# Patient Record
Sex: Male | Born: 1940 | Race: White | Hispanic: No | Marital: Married | Smoking: Never smoker
Health system: Southern US, Community
[De-identification: ages and names within clinical notes are randomized; demographics above are authoritative.]

## PROBLEM LIST (undated history)

## (undated) DIAGNOSIS — N289 Disorder of kidney and ureter, unspecified: Secondary | ICD-10-CM

## (undated) DIAGNOSIS — F0280 Dementia in other diseases classified elsewhere without behavioral disturbance: Secondary | ICD-10-CM

## (undated) DIAGNOSIS — I10 Essential (primary) hypertension: Secondary | ICD-10-CM

## (undated) DIAGNOSIS — G4700 Insomnia, unspecified: Secondary | ICD-10-CM

## (undated) DIAGNOSIS — N400 Enlarged prostate without lower urinary tract symptoms: Secondary | ICD-10-CM

## (undated) DIAGNOSIS — H269 Unspecified cataract: Secondary | ICD-10-CM

## (undated) DIAGNOSIS — G309 Alzheimer's disease, unspecified: Secondary | ICD-10-CM

---

## 2018-12-02 ENCOUNTER — Emergency Department: Payer: Medicare Other

## 2018-12-02 ENCOUNTER — Encounter: Payer: Self-pay | Admitting: Emergency Medicine

## 2018-12-02 ENCOUNTER — Other Ambulatory Visit: Payer: Self-pay

## 2018-12-02 ENCOUNTER — Emergency Department
Admission: EM | Admit: 2018-12-02 | Discharge: 2018-12-02 | Disposition: A | Discharge status: Skilled Nursing Facility | Payer: Medicare Other | Attending: Student in an Organized Health Care Education/Training Program | Admitting: Student in an Organized Health Care Education/Training Program

## 2018-12-02 DIAGNOSIS — N189 Chronic kidney disease, unspecified: Secondary | ICD-10-CM | POA: Insufficient documentation

## 2018-12-02 DIAGNOSIS — Z7982 Long term (current) use of aspirin: Secondary | ICD-10-CM | POA: Insufficient documentation

## 2018-12-02 DIAGNOSIS — G309 Alzheimer's disease, unspecified: Secondary | ICD-10-CM | POA: Insufficient documentation

## 2018-12-02 DIAGNOSIS — R531 Weakness: Secondary | ICD-10-CM | POA: Diagnosis not present

## 2018-12-02 DIAGNOSIS — N3000 Acute cystitis without hematuria: Secondary | ICD-10-CM | POA: Insufficient documentation

## 2018-12-02 DIAGNOSIS — Z79899 Other long term (current) drug therapy: Secondary | ICD-10-CM | POA: Diagnosis not present

## 2018-12-02 DIAGNOSIS — R55 Syncope and collapse: Secondary | ICD-10-CM | POA: Diagnosis present

## 2018-12-02 DIAGNOSIS — I129 Hypertensive chronic kidney disease with stage 1 through stage 4 chronic kidney disease, or unspecified chronic kidney disease: Secondary | ICD-10-CM | POA: Diagnosis not present

## 2018-12-02 HISTORY — DX: Benign prostatic hyperplasia without lower urinary tract symptoms: N40.0

## 2018-12-02 HISTORY — DX: Dementia in other diseases classified elsewhere, unspecified severity, without behavioral disturbance, psychotic disturbance, mood disturbance, and anxiety: F02.80

## 2018-12-02 HISTORY — DX: Disorder of kidney and ureter, unspecified: N28.9

## 2018-12-02 HISTORY — DX: Unspecified cataract: H26.9

## 2018-12-02 HISTORY — DX: Essential (primary) hypertension: I10

## 2018-12-02 HISTORY — DX: Alzheimer's disease, unspecified: G30.9

## 2018-12-02 HISTORY — DX: Insomnia, unspecified: G47.00

## 2018-12-02 LAB — URINALYSIS, COMPLETE (UACMP) WITH MICROSCOPIC
Bilirubin Urine: NEGATIVE
Glucose, UA: NEGATIVE mg/dL
Ketones, ur: NEGATIVE mg/dL
Nitrite: POSITIVE — AB
Protein, ur: NEGATIVE mg/dL
SPECIFIC GRAVITY, URINE: 1.011 (ref 1.005–1.030)
Squamous Epithelial / HPF: NONE SEEN (ref 0–5)
pH: 6 (ref 5.0–8.0)

## 2018-12-02 LAB — CBC
HCT: 40.4 % (ref 39.0–52.0)
Hemoglobin: 12.9 g/dL — ABNORMAL LOW (ref 13.0–17.0)
MCH: 30.6 pg (ref 26.0–34.0)
MCHC: 31.9 g/dL (ref 30.0–36.0)
MCV: 95.7 fL (ref 80.0–100.0)
PLATELETS: 143 10*3/uL — AB (ref 150–400)
RBC: 4.22 MIL/uL (ref 4.22–5.81)
RDW: 14.2 % (ref 11.5–15.5)
WBC: 10.6 10*3/uL — ABNORMAL HIGH (ref 4.0–10.5)
nRBC: 0 % (ref 0.0–0.2)

## 2018-12-02 LAB — BASIC METABOLIC PANEL
Anion gap: 9 (ref 5–15)
BUN: 19 mg/dL (ref 8–23)
CALCIUM: 8.2 mg/dL — AB (ref 8.9–10.3)
CO2: 24 mmol/L (ref 22–32)
Chloride: 108 mmol/L (ref 98–111)
Creatinine, Ser: 0.76 mg/dL (ref 0.61–1.24)
GFR calc Af Amer: >60 mL/min (ref >60)
GFR calc non Af Amer: >60 mL/min (ref >60)
Glucose, Bld: 94 mg/dL (ref 70–99)
Potassium: 4 mmol/L (ref 3.5–5.1)
SODIUM: 141 mmol/L (ref 135–145)

## 2018-12-02 LAB — TROPONIN I
Troponin I: <0.03 ng/mL (ref <0.03)
Troponin I: <0.03 ng/mL (ref <0.03)

## 2018-12-02 MED ORDER — SODIUM CHLORIDE 0.9 % IV BOLUS
500.0000 mL | Freq: Once | INTRAVENOUS | Status: AC
  Administered 2018-12-02: 500 mL via INTRAVENOUS

## 2018-12-02 MED ORDER — SODIUM CHLORIDE 0.9% FLUSH
3.0000 mL | Freq: Once | INTRAVENOUS | Status: AC
  Administered 2018-12-02: 3 mL via INTRAVENOUS

## 2018-12-02 MED ORDER — CEPHALEXIN 500 MG PO CAPS: 500.0000 mg | ORAL_CAPSULE | Freq: Three times a day (TID) | ORAL | 0 refills | Status: AC

## 2018-12-02 MED ORDER — SODIUM CHLORIDE 0.9 % IV SOLN
1.0000 g | Freq: Once | INTRAVENOUS | Status: AC
  Administered 2018-12-02: 1 g via INTRAVENOUS
  Filled 2018-12-02: qty 10

## 2018-12-02 NOTE — ED Notes (Signed)
Patient AAX3 appropriate for baseline. Vital signs stable. NAD

## 2018-12-02 NOTE — ED Provider Notes (Signed)
[facility] Emergency Department Provider Note    First MD Initiated Contact with Patient 12/02/18 1205     (approximate)  I have reviewed the triage vital signs and the nursing notes.   HISTORY  Chief Complaint Near Syncope    HPI Steve Tran is a 78 y.o. male the below listed past medical history presents the ER for near syncope and weakness.  States he was feeling very fatigued today.  Denies any chest pain or shortness of breath.  Did not fall and hit his head.  Daughter at bedside states that he has had similar symptoms when he was diagnosed with UTI in the past.  No measured fevers.  Reportedly did have low blood pressure when he first was about by EMS was given small bolus with improvement in symptoms.    Past Medical History:  Diagnosis Date  . Alzheimer disease (HCC)   . Benign prostatic hyperplasia   . Cataract   . Hypertension   . Insomnia   . Renal disorder    CKD   No family history on file.  There are no active problems to display for this patient.     Prior to Admission medications   Medication Sig Start Date End Date Taking? Authorizing Provider  aspirin 81 MG chewable tablet Chew 81 mg by mouth daily.   Yes [provider]  calcium-vitamin D (OSCAL WITH D) 500-200 MG-UNIT tablet Take 1 tablet by mouth daily.   Yes [provider]  Cinnamon 500 MG capsule Take 500 mg by mouth daily.   Yes [provider]  finasteride (PROSCAR) 5 MG tablet Take 5 mg by mouth daily.   Yes [provider]  guaiFENesin (ROBITUSSIN) 100 MG/5ML SOLN Take 10 mLs by mouth every 4 (four) hours as needed for cough or to loosen phlegm.   Yes [provider]  rosuvastatin (CRESTOR) 40 MG tablet Take 40 mg by mouth daily.   Yes [provider]  tamsulosin (FLOMAX) 0.4 MG CAPS capsule Take 0.4 mg by mouth.   Yes [provider]  traZODone (DESYREL) 50 MG tablet Take 50 mg by mouth daily.   Yes  [provider]  vitamin B-12 (CYANOCOBALAMIN) 1000 MCG tablet Take 2,000 mcg by mouth daily.   Yes [provider]  cephALEXin (KEFLEX) 500 MG capsule Take 1 capsule (500 mg total) by mouth 3 (three) times daily for 7 days. 12/02/18 12/09/18  Willy Eddy, MD    Allergies Patient has no known allergies.    Social History Social History   Tobacco Use  . Smoking status: Never Smoker  . Smokeless tobacco: Never Used  Substance Use Topics  . Alcohol use: Not Currently  . Drug use: Not Currently    Review of Systems Patient denies headaches, rhinorrhea, blurry vision, numbness, shortness of breath, chest pain, edema, cough, abdominal pain, nausea, vomiting, diarrhea, dysuria, fevers, rashes or hallucinations unless otherwise stated above in HPI. ____________________________________________   PHYSICAL EXAM:  VITAL SIGNS: Vitals:   12/02/18 1124 12/02/18 1414  BP: 137/66 130/62  Pulse: (!) 57 61  Resp: 16 18  Temp: 98.2 F (36.8 C)   SpO2: 100% 99%    Constitutional: Alert , non toxic appearing  Eyes: Conjunctivae are normal.  Head: Atraumatic. Nose: No congestion/rhinnorhea. Mouth/Throat: Mucous membranes are moist.   Neck: No stridor. Painless ROM.  Cardiovascular: Normal rate, regular rhythm. Grossly normal heart sounds.  Good peripheral circulation. Respiratory: Normal respiratory effort.  No retractions. Lungs  CTAB. Gastrointestinal: Soft and nontender. No distention. No abdominal bruits. No CVA tenderness. Genitourinary:  Musculoskeletal: No lower extremity tenderness nor edema.  No joint effusions. Neurologic:  Normal speech and language. No gross focal neurologic deficits are appreciated. No facial droop Skin:  Skin is warm, dry and intact. No rash noted. Psychiatric: Mood and affect are normal. Speech and behavior are normal.  ____________________________________________   LABS (all labs ordered are listed, but only abnormal results are  displayed)  Results for orders placed or performed during the hospital encounter of 12/02/18 (from the past 24 hour(s))  Basic metabolic panel     Status: Abnormal   Collection Time: 12/02/18 11:20 AM  Result Value Ref Range   Sodium 141 135 - 145 mmol/L   Potassium 4.0 3.5 - 5.1 mmol/L   Chloride 108 98 - 111 mmol/L   CO2 24 22 - 32 mmol/L   Glucose, Bld 94 70 - 99 mg/dL   BUN 19 8 - 23 mg/dL   Creatinine, Ser 9.60 0.61 - 1.24 mg/dL   Calcium 8.2 (L) 8.9 - 10.3 mg/dL   GFR calc non Af Amer >60 >60 mL/min   GFR calc Af Amer >60 >60 mL/min   Anion gap 9 5 - 15  CBC     Status: Abnormal   Collection Time: 12/02/18 11:20 AM  Result Value Ref Range   WBC 10.6 (H) 4.0 - 10.5 K/uL   RBC 4.22 4.22 - 5.81 MIL/uL   Hemoglobin 12.9 (L) 13.0 - 17.0 g/dL   HCT 45.4 09.8 - 11.9 %   MCV 95.7 80.0 - 100.0 fL   MCH 30.6 26.0 - 34.0 pg   MCHC 31.9 30.0 - 36.0 g/dL   RDW 14.7 82.9 - 56.2 %   Platelets 143 (L) 150 - 400 K/uL   nRBC 0.0 0.0 - 0.2 %  Troponin I - ONCE - STAT     Status: None   Collection Time: 12/02/18 11:20 AM  Result Value Ref Range   Troponin I <0.03 <0.03 ng/mL  Urinalysis, Complete w Microscopic     Status: Abnormal   Collection Time: 12/02/18  2:13 PM  Result Value Ref Range   Color, Urine YELLOW (A) YELLOW   APPearance CLEAR (A) CLEAR   Specific Gravity, Urine 1.011 1.005 - 1.030   pH 6.0 5.0 - 8.0   Glucose, UA NEGATIVE NEGATIVE mg/dL   Hgb urine dipstick SMALL (A) NEGATIVE   Bilirubin Urine NEGATIVE NEGATIVE   Ketones, ur NEGATIVE NEGATIVE mg/dL   Protein, ur NEGATIVE NEGATIVE mg/dL   Nitrite POSITIVE (A) NEGATIVE   Leukocytes,Ua SMALL (A) NEGATIVE   RBC / HPF 0-5 0 - 5 RBC/hpf   WBC, UA 11-20 0 - 5 WBC/hpf   Bacteria, UA RARE (A) NONE SEEN   Squamous Epithelial / LPF NONE SEEN 0 - 5   Mucus PRESENT    ____________________________________________  EKG My review and personal interpretation at Time: 12:58   Indication: weakness  Rate: 65  Rhythm: sinus  Axis: normal Other: normal intervals, no stemi ____________________________________________  RADIOLOGY  I personally reviewed all radiographic images ordered to evaluate for the above acute complaints and reviewed radiology reports and findings.  These findings were personally discussed with the patient.  Please see medical record for radiology report.  ____________________________________________   PROCEDURES  Procedure(s) performed:  Procedures    Critical Care performed: no ____________________________________________   INITIAL IMPRESSION / ASSESSMENT AND PLAN / ED COURSE  Pertinent labs & imaging results  that were available during my care of the patient were reviewed by me and considered in my medical decision making (see chart for details).   DDX: Dehydration, electrolyte abnormality, ACS, dysrhythmia, pneumonia, anemia, UTI  Grace Ambrogio is a 78 y.o. who presents to the ED with symptoms as described above.  Patient currently nontoxic-appearing.  May have had a component of dehydration but was given IV fluids with improvement.  He is currently now tolerating oral hydration.  His abdominal exam is soft and benign.  EKG does not show signs of acute ischemia.  Initial troponin is negative will further stratify with repeat enzyme.  Will check urine to evaluate for any evidence of cystitis.  Clinical Course as of Dec 01 1517  Mon Dec 02, 2018  1514 Discussed results with patient.  Plan will be to give dose of IV Rocephin as well as encourage oral hydration.  If patient able to ambulate and tolerate oral hydration will be appropriate for discharge home.   [PR]    Clinical Course User Index [PR] Willy Eddy, MD     As part of my medical decision making, I reviewed the following data within the electronic MEDICAL RECORD NUMBER Nursing notes reviewed and incorporated, Labs reviewed, notes from prior ED visits and [place] Controlled Substance  Database   ____________________________________________   FINAL CLINICAL IMPRESSION(S) / ED DIAGNOSES  Final diagnoses:  Acute cystitis without hematuria  Weakness      NEW MEDICATIONS STARTED DURING THIS VISIT:  New Prescriptions   CEPHALEXIN (KEFLEX) 500 MG CAPSULE    Take 1 capsule (500 mg total) by mouth 3 (three) times daily for 7 days.     Note:  This document was prepared using Dragon voice recognition software and may include unintentional dictation errors.    Willy Eddy, MD 12/02/18 [phone]

## 2018-12-02 NOTE — ED Triage Notes (Signed)
Arrives from [facility] via [facility] for evaluation of near syncope. EMS reports that patient was in dining room when staff noticed patient having a near syncopal event.  BP at that time was 90/50.  EMS states on their arrival BP:  142 64  P:  69  SPO2:  98 T 98.2.  Patient reports he feels fine now.    20 g saline lock started PTA and 25 mg BEnadryl given by EMS for patient c/o itching and rash to trunk.

## 2018-12-05 LAB — URINE CULTURE

## 2019-11-15 ENCOUNTER — Emergency Department: Payer: Medicare Other

## 2019-11-15 ENCOUNTER — Other Ambulatory Visit: Payer: Self-pay

## 2019-11-15 ENCOUNTER — Emergency Department
Admission: EM | Admit: 2019-11-15 | Discharge: 2019-11-15 | Disposition: A | Discharge status: Skilled Nursing Facility | Payer: Medicare Other | Attending: Emergency Medicine | Admitting: Emergency Medicine

## 2019-11-15 ENCOUNTER — Encounter: Payer: Self-pay | Admitting: Emergency Medicine

## 2019-11-15 DIAGNOSIS — Z23 Encounter for immunization: Secondary | ICD-10-CM | POA: Diagnosis not present

## 2019-11-15 DIAGNOSIS — Z7982 Long term (current) use of aspirin: Secondary | ICD-10-CM | POA: Insufficient documentation

## 2019-11-15 DIAGNOSIS — W19XXXA Unspecified fall, initial encounter: Secondary | ICD-10-CM | POA: Insufficient documentation

## 2019-11-15 DIAGNOSIS — S0003XA Contusion of scalp, initial encounter: Secondary | ICD-10-CM | POA: Insufficient documentation

## 2019-11-15 DIAGNOSIS — Z79899 Other long term (current) drug therapy: Secondary | ICD-10-CM | POA: Diagnosis not present

## 2019-11-15 DIAGNOSIS — Y92039 Unspecified place in apartment as the place of occurrence of the external cause: Secondary | ICD-10-CM | POA: Diagnosis not present

## 2019-11-15 DIAGNOSIS — Y939 Activity, unspecified: Secondary | ICD-10-CM | POA: Diagnosis not present

## 2019-11-15 DIAGNOSIS — Y999 Unspecified external cause status: Secondary | ICD-10-CM | POA: Insufficient documentation

## 2019-11-15 DIAGNOSIS — I10 Essential (primary) hypertension: Secondary | ICD-10-CM | POA: Diagnosis not present

## 2019-11-15 DIAGNOSIS — S0990XA Unspecified injury of head, initial encounter: Secondary | ICD-10-CM | POA: Diagnosis present

## 2019-11-15 DIAGNOSIS — G309 Alzheimer's disease, unspecified: Secondary | ICD-10-CM | POA: Diagnosis not present

## 2019-11-15 MED ORDER — TETANUS-DIPHTH-ACELL PERTUSSIS 5-2.5-18.5 LF-MCG/0.5 IM SUSP
0.5000 mL | Freq: Once | INTRAMUSCULAR | Status: AC
  Administered 2019-11-15: 23:00:00 0.5 mL via INTRAMUSCULAR
  Filled 2019-11-15: qty 0.5

## 2019-11-15 NOTE — ED Provider Notes (Signed)
[facility] Emergency Department Provider Note   ____________________________________________   First MD Initiated Contact with Patient 11/15/19 2125     (approximate)  I have reviewed the triage vital signs and the nursing notes.   HISTORY  Chief Complaint Fall    HPI Steve Tran is a 79 y.o. male with possible history of Alzheimer's dementia and hypertension who presents to the ED following fall.  Patient reports that just prior to arrival he was walking in his apartment when he slipped and fell, striking his head.  He denies losing consciousness and fall was not witnessed by staff at [facility], but they arrived shortly afterwards and found patient at his baseline mental status.  With EMS he had complained of some blood dripping down the right side of his face, but he denies any headache, neck pain, or other pain.  He does not take any blood thinners outside of a baby aspirin.        Past Medical History:  Diagnosis Date  . Alzheimer disease ([place])   . Benign prostatic hyperplasia   . Cataract   . Hypertension   . Insomnia   . Renal disorder    CKD    There are no problems to display for this patient.   History reviewed. No pertinent surgical history.  Prior to Admission medications   Medication Sig Start Date End Date Taking? Authorizing Provider  aspirin 81 MG chewable tablet Chew 81 mg by mouth daily.    [provider]  calcium-vitamin D (OSCAL WITH D) 500-200 MG-UNIT tablet Take 1 tablet by mouth daily.    [provider]  Cinnamon 500 MG capsule Take 500 mg by mouth daily.    [provider]  finasteride (PROSCAR) 5 MG tablet Take 5 mg by mouth daily.    [provider]  guaiFENesin (ROBITUSSIN) 100 MG/5ML SOLN Take 10 mLs by mouth every 4 (four) hours as needed for cough or to loosen phlegm.    [provider]  rosuvastatin (CRESTOR) 40 MG tablet Take 40 mg by mouth daily.    [provider]  tamsulosin (FLOMAX) 0.4 MG CAPS capsule Take 0.4 mg by mouth.    [provider]  traZODone (DESYREL) 50 MG tablet Take 50 mg by mouth daily.    [provider]  vitamin B-12 (CYANOCOBALAMIN) 1000 MCG tablet Take 2,000 mcg by mouth daily.    [provider]    Allergies Patient has no known allergies.  No family history on file.  Social History Social History   Tobacco Use  . Smoking status: Never Smoker  . Smokeless tobacco: Never Used  Substance Use Topics  . Alcohol use: Not Currently  . Drug use: Not Currently    Review of Systems  Constitutional: No fever/chills Eyes: No visual changes. ENT: No sore throat. Cardiovascular: Denies chest pain. Respiratory: Denies shortness of breath. Gastrointestinal: No abdominal pain.  No nausea, no vomiting.  No diarrhea.  No constipation. Genitourinary: Negative for dysuria. Musculoskeletal: Negative for back pain. Skin: Negative for rash.  Positive for scalp laceration. Neurological: Negative for headaches, focal weakness or numbness.  ____________________________________________   PHYSICAL EXAM:  VITAL SIGNS: ED Triage Vitals [11/15/19 2126]  Enc Vitals Group     BP (!) 172/73     Pulse Rate 62     Resp 14     Temp (!) 97.4 F (36.3 C)     Temp Source Oral     SpO2  100 %     Weight 160 lb (72.6 kg)     Height 6' (1.829 m)     Head Circumference      Peak Flow      Pain Score 0     Pain Loc      Pain Edu?      Excl. in GC?     Constitutional: Alert and oriented. Eyes: Conjunctivae are normal. Head: Small hematoma to right frontal scalp with overlying abrasion but no laceration.  No facial bony tenderness or step-offs. Nose: No congestion/rhinnorhea. Mouth/Throat: Mucous membranes are moist. Neck: Normal ROM Cardiovascular: Normal rate, regular rhythm. Grossly normal heart sounds. Respiratory: Normal respiratory effort.  No retractions. Lungs CTAB. Gastrointestinal:  Soft and nontender. No distention. Genitourinary: deferred Musculoskeletal: No lower extremity tenderness nor edema. Neurologic:  Normal speech and language. No gross focal neurologic deficits are appreciated. Skin:  Skin is warm, dry and intact. No rash noted. Psychiatric: Mood and affect are normal. Speech and behavior are normal.  ____________________________________________   LABS (all labs ordered are listed, but only abnormal results are displayed)  Labs Reviewed - No data to display ____________________________________________  EKG  ED ECG REPORT I, Chesley Noon, the attending physician, personally viewed and interpreted this ECG.   Date: 11/15/2019  EKG Time: 21:25  Rate: 65  Rhythm: normal sinus rhythm  Axis: Normal  Intervals:none  ST&T Change: None   PROCEDURES  Procedure(s) performed (including Critical Care):  Procedures   ____________________________________________   INITIAL IMPRESSION / ASSESSMENT AND PLAN / ED COURSE       79 year old male presents to the ED after reported mechanical fall where he slipped and hit his head, but denies losing consciousness.  He is at his baseline mental status and neurologically intact with no focal deficits.  He has a hematoma and small abrasion to his right frontal scalp that is not in need of repair, will update tetanus.  EKG without evidence of arrhythmia or ischemia.  Will check CT head and C-spine for traumatic injury, if these are negative patient would be appropriate for discharge back to nursing facility.  Daughter is at bedside and agrees with plan.  CT scans negative for acute process, patient remains at baseline mental status and is appropriate for discharge home.      ____________________________________________   FINAL CLINICAL IMPRESSION(S) / ED DIAGNOSES  Final diagnoses:  Fall, initial encounter  Hematoma of scalp, initial encounter     ED Discharge Orders    None       Note:  This  document was prepared using Dragon voice recognition software and may include unintentional dictation errors.   Chesley Noon, MD 11/15/19 2257

## 2019-11-15 NOTE — ED Notes (Signed)
Blood was washed off Patient's right temple with warm wipes. Laceration covered with gauze and paper tape. Patient tolerated procedure well.

## 2019-11-15 NOTE — ED Triage Notes (Signed)
Pt from [facility] with unwittnessed fall. Pt with laceration and hematoma noted to right forehead. Pt denies loc. md at bedside.

## 2019-11-15 NOTE — ED Notes (Signed)
Daughter at bedside.

## 2019-11-15 NOTE — ED Notes (Signed)
Report given to Rebecca RN.

## 2020-09-25 DEATH — deceased

## 2020-10-23 IMAGING — CT CT HEAD W/O CM
3 series · 15 of 47 positions shown, 18 images · non-contrast
Comparison: None.

CLINICAL DATA: 78-year-old male with head trauma.

EXAM:
CT HEAD WITHOUT CONTRAST
CT CERVICAL SPINE WITHOUT CONTRAST
TECHNIQUE: Multidetector CT imaging of the head and cervical spine was
performed following the standard protocol without intravenous
contrast. Multiplanar CT image reconstructions of the cervical spine
were also generated.

[Series 2: head wo · axial · 0.43mm/px · z∈[+222,+347]mm · 9 of 30 slices shown, 12 images]
[im 3/30  brain]
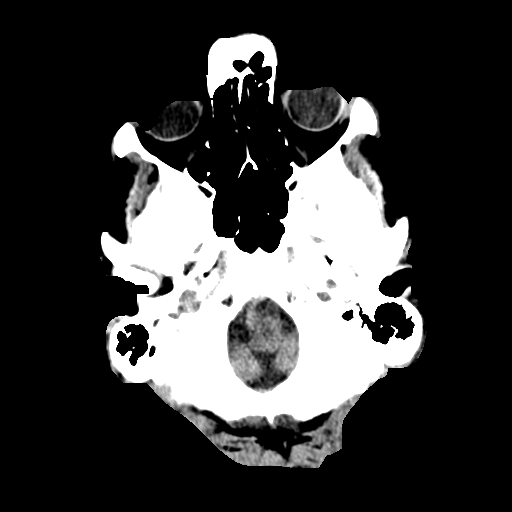
[im 3/30  bone]
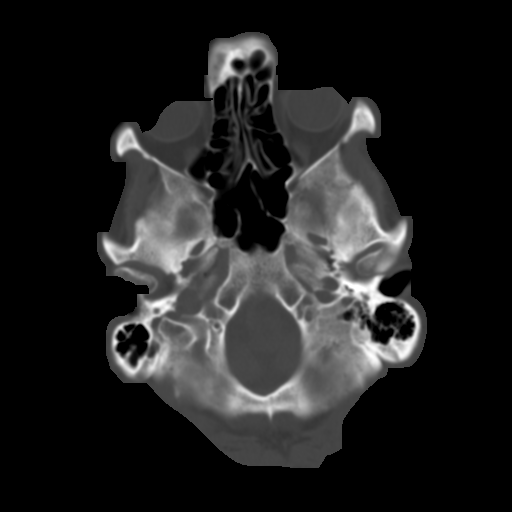
[im 6/30  brain]
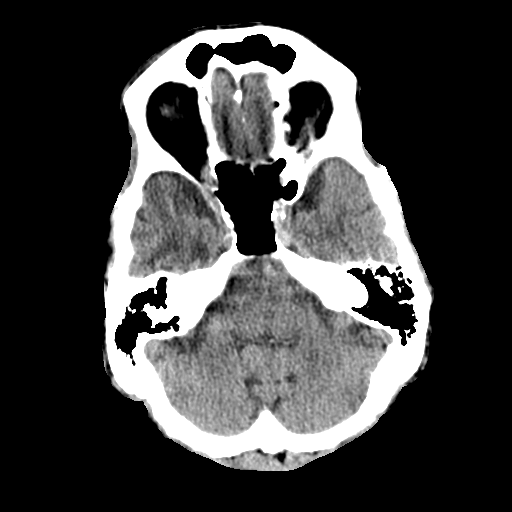
[im 9/30  brain]
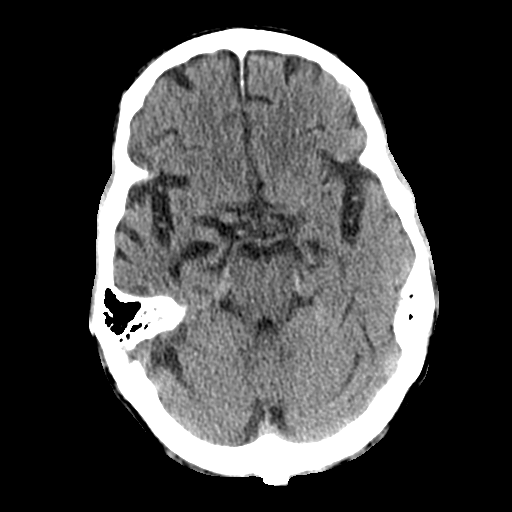
[im 12/30  brain]
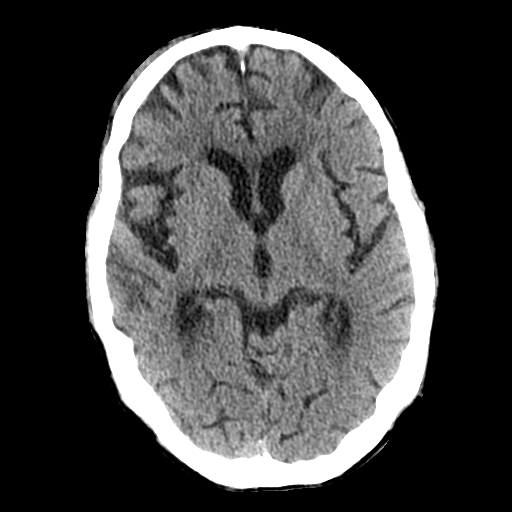
[im 16/30  brain]
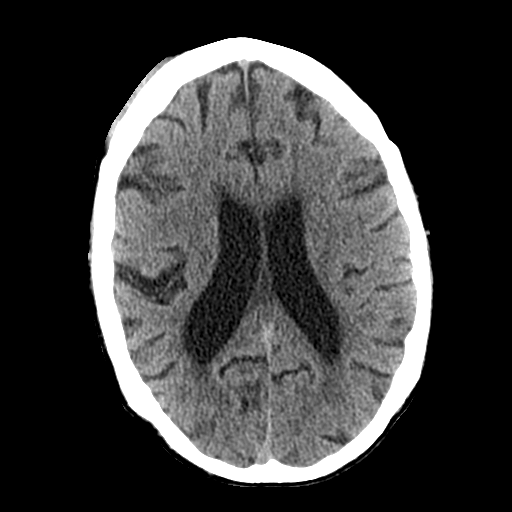
[im 16/30  bone]
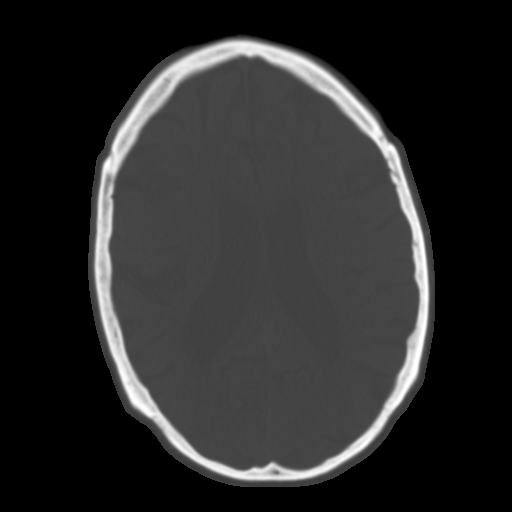
[im 19/30  brain]
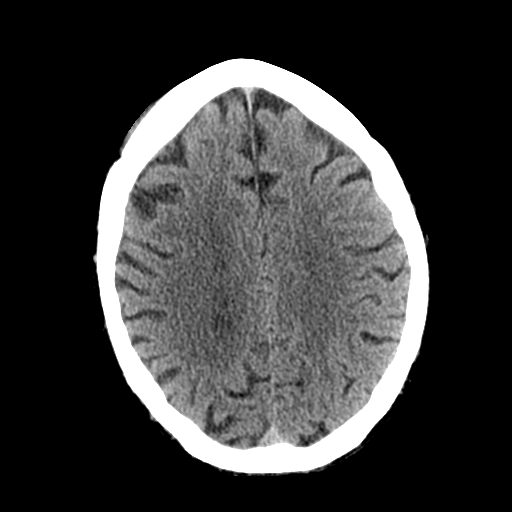
[im 22/30  brain]
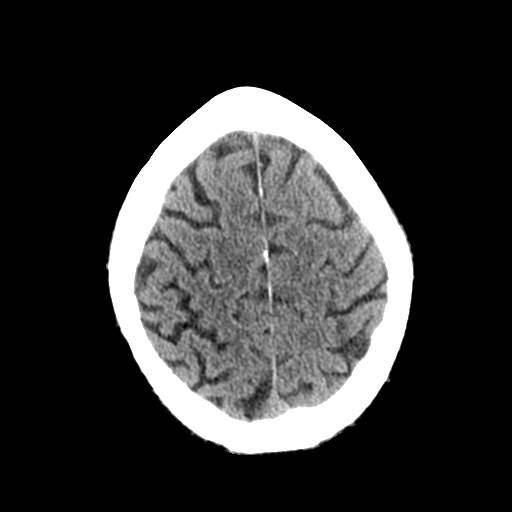
[im 25/30  brain]
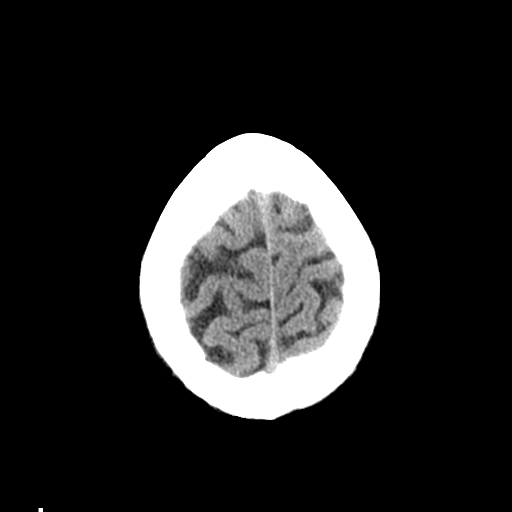
[im 28/30  brain]
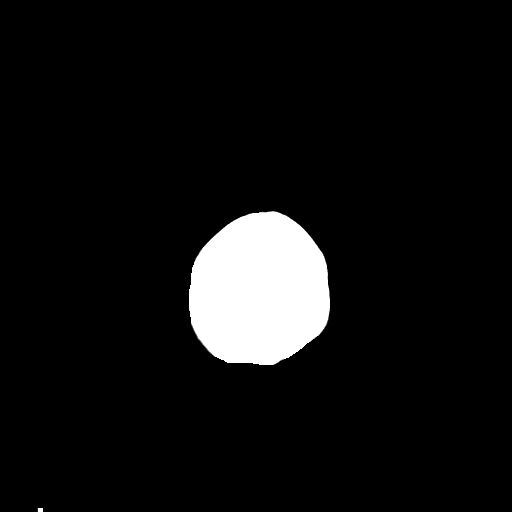
[im 28/30  bone]
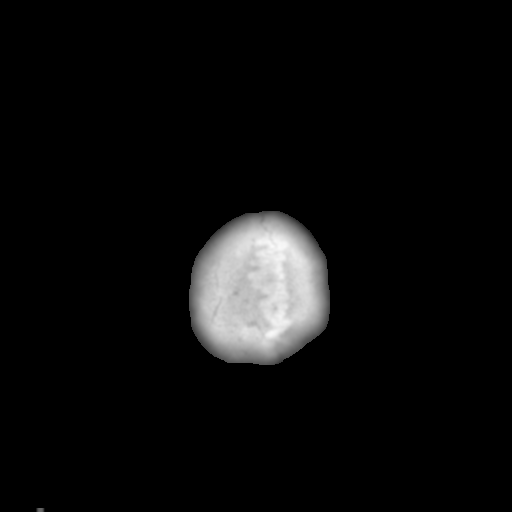

[Series 4: coronal soft tissue · coronal · 0.28mm/px · 3 of 66 slices shown]
[im 22/66  brain]
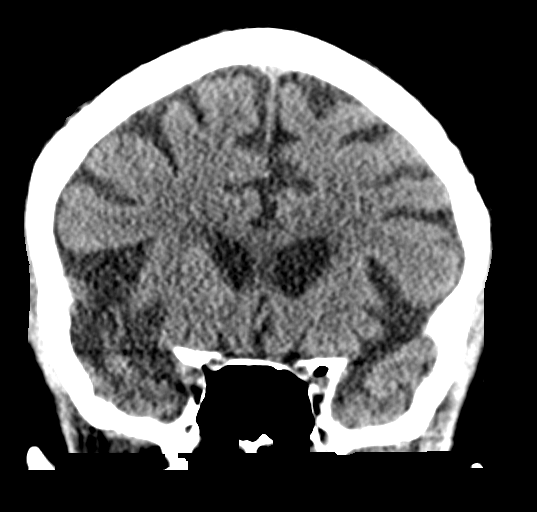
[im 29/66  brain]
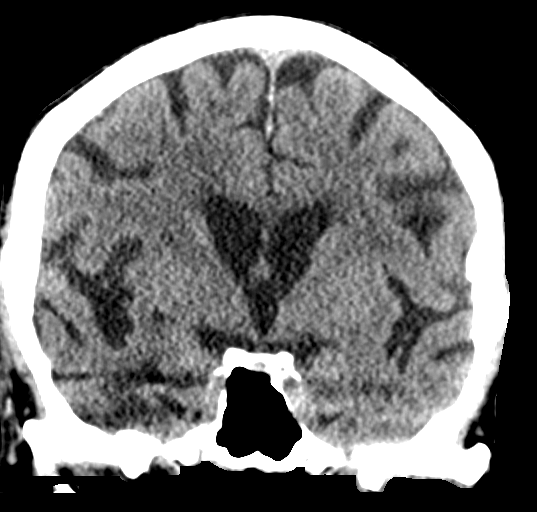
[im 37/66  brain]
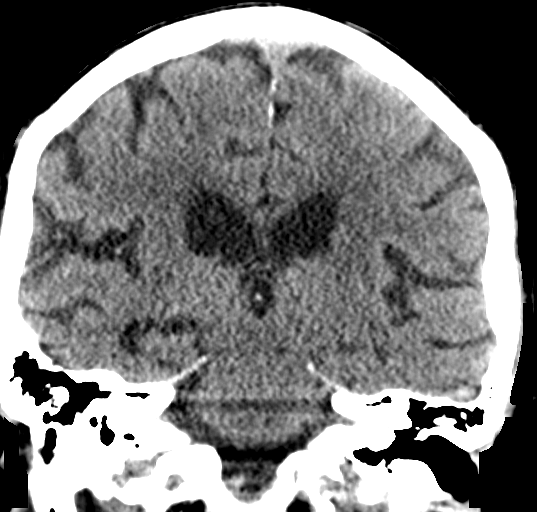

[Series 5: sagittal soft tissue · sagittal · 0.33mm/px · 3 of 51 slices shown]
[im 17/51  brain]
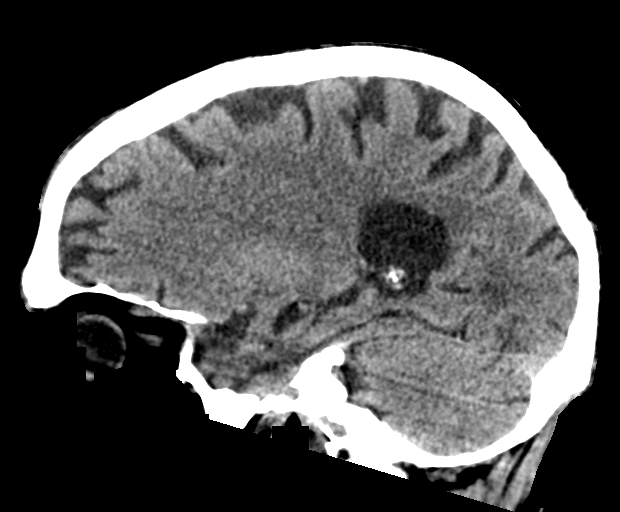
[im 26/51  brain]
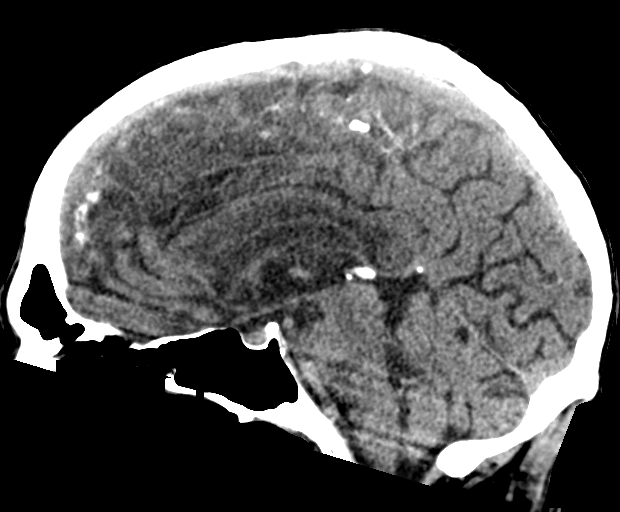
[im 34/51  brain]
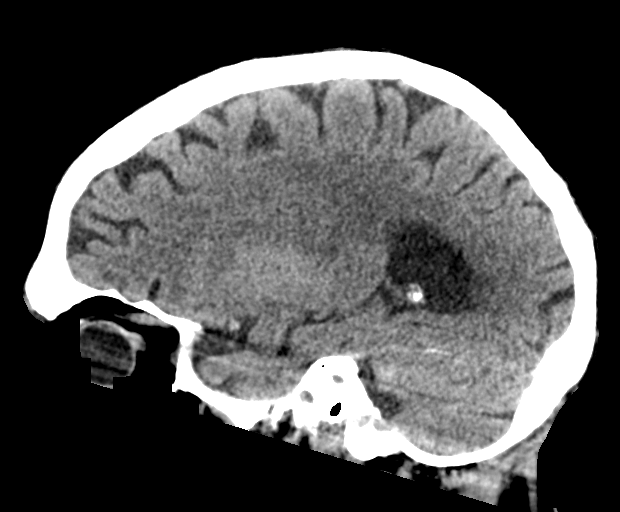

[15 of 47 positions shown; findings below may reference images not displayed]

FINDINGS: CT HEAD FINDINGS

Brain: Mild age-related atrophy and chronic microvascular ischemic
changes. There is no acute intracranial hemorrhage. No mass effect
or midline shift. No extra-axial fluid collection.

Vascular: No hyperdense vessel or unexpected calcification.

Skull: Normal. Negative for fracture or focal lesion.

Sinuses/Orbits: No acute finding.

Other: Right forehead hematoma.

CT CERVICAL SPINE FINDINGS

Alignment: No acute subluxation.

Skull base and vertebrae: No acute fracture.  Osteopenia.

Soft tissues and spinal canal: No prevertebral fluid or swelling. No
visible canal hematoma.

Disc levels: Multilevel degenerative changes and multilevel facet
arthropathy.

Upper chest: Biapical subpleural scarring.

Other: Bilateral carotid bulb calcified plaques.
IMPRESSION: 1. No acute intracranial hemorrhage.
2. No acute/traumatic cervical spine pathology.

## 2020-10-23 IMAGING — CT CT CERVICAL SPINE W/O CM
2 series · 10 of 27 positions shown, 13 images · non-contrast
Comparison: None.

CLINICAL DATA: 78-year-old male with head trauma.

EXAM:
CT HEAD WITHOUT CONTRAST
CT CERVICAL SPINE WITHOUT CONTRAST
TECHNIQUE: Multidetector CT imaging of the head and cervical spine was
performed following the standard protocol without intravenous
contrast. Multiplanar CT image reconstructions of the cervical spine
were also generated.

[Series 3: c spine soft · axial · 0.45mm/px · z∈[+85,+197]mm · 5 of 77 slices shown, 7 images]
[im 12/77  soft-tissue]
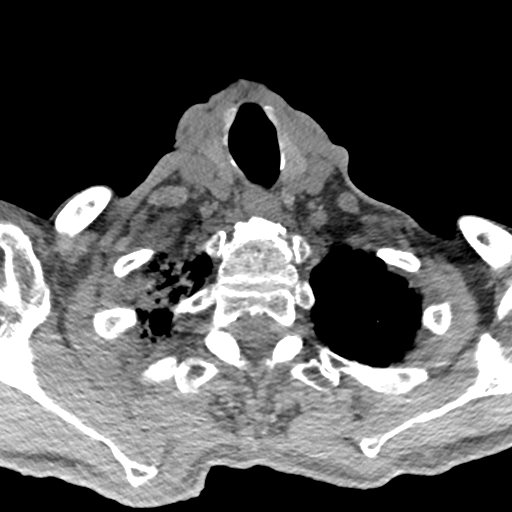
[im 12/77  bone]
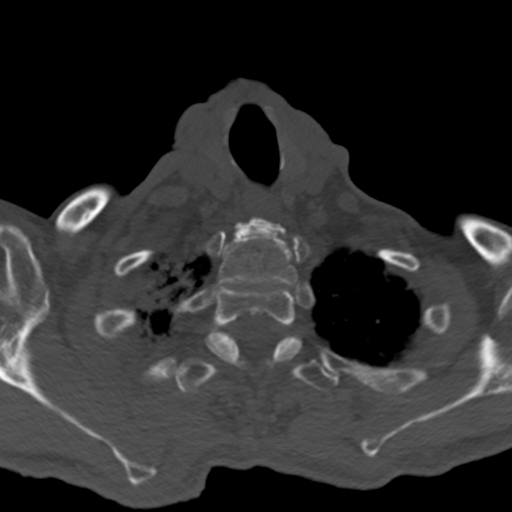
[im 24/77  bone]
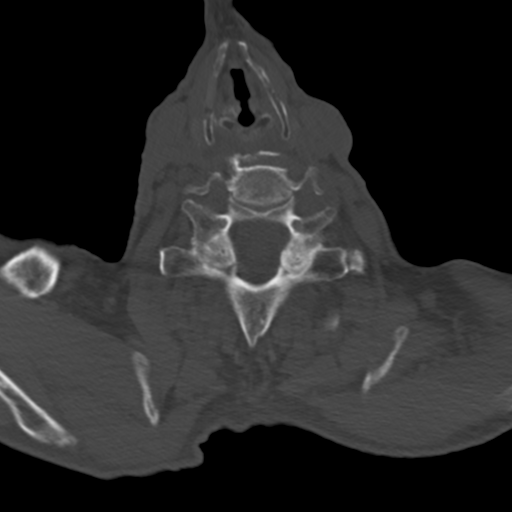
[im 41/77  bone]
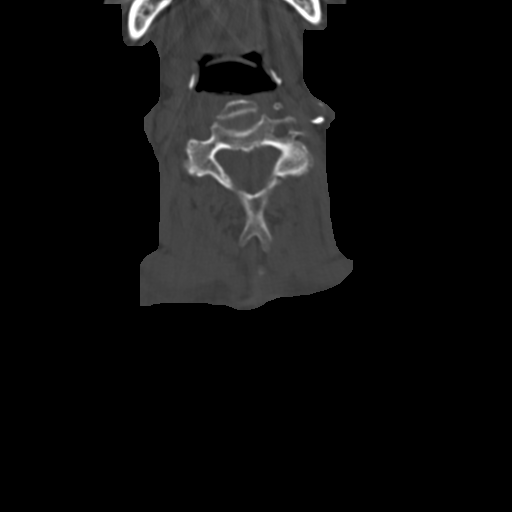
[im 53/77  bone]
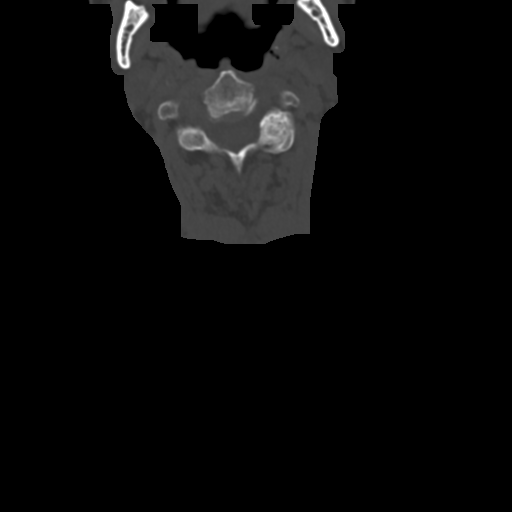
[im 65/77  soft-tissue]
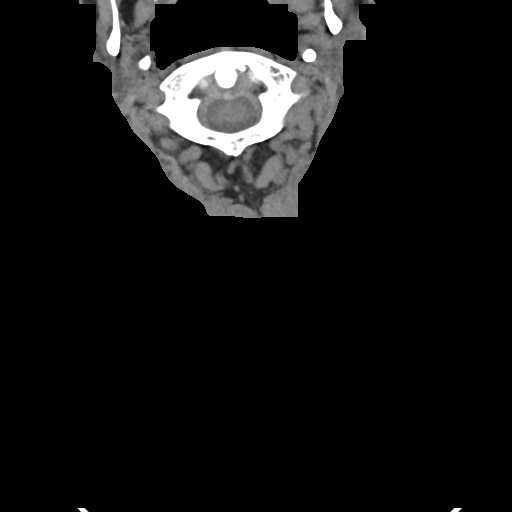
[im 65/77  bone]
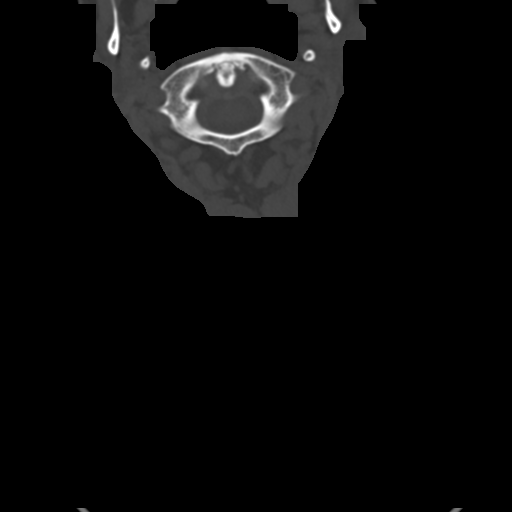

[Series 4: sagittal bone · sagittal · 0.24mm/px · 5 of 58 slices shown, 6 images]
[im 20/58  bone]
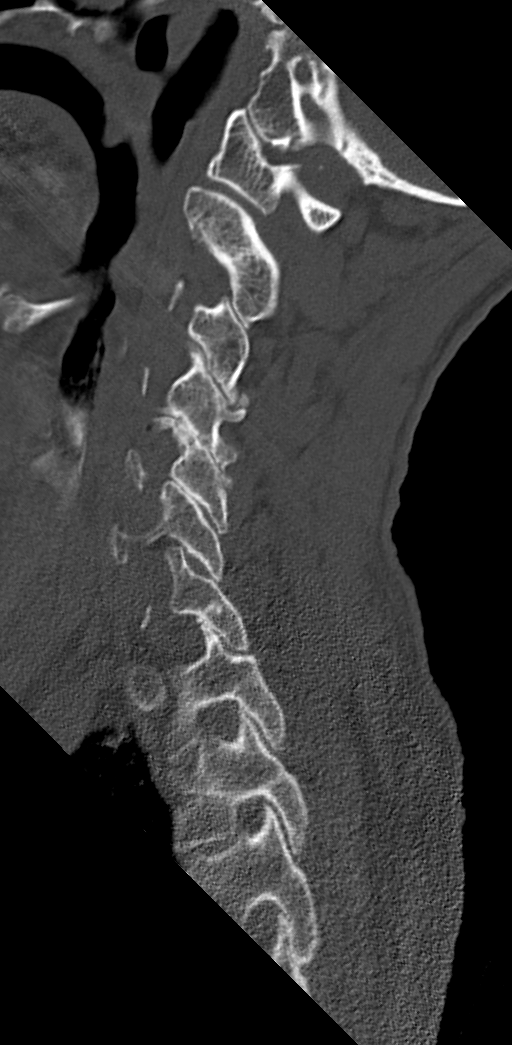
[im 24/58  bone]
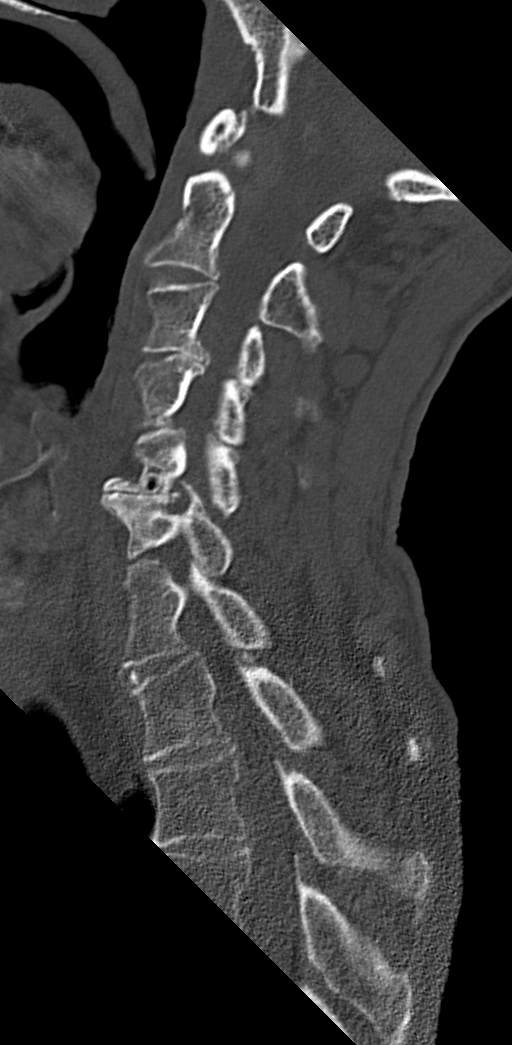
[im 29/58  soft-tissue]
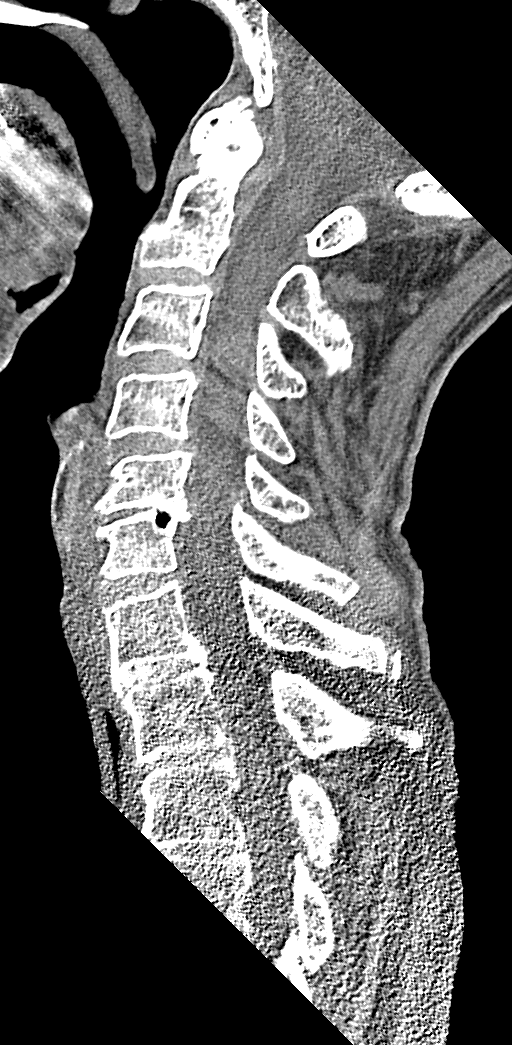
[im 29/58  bone]
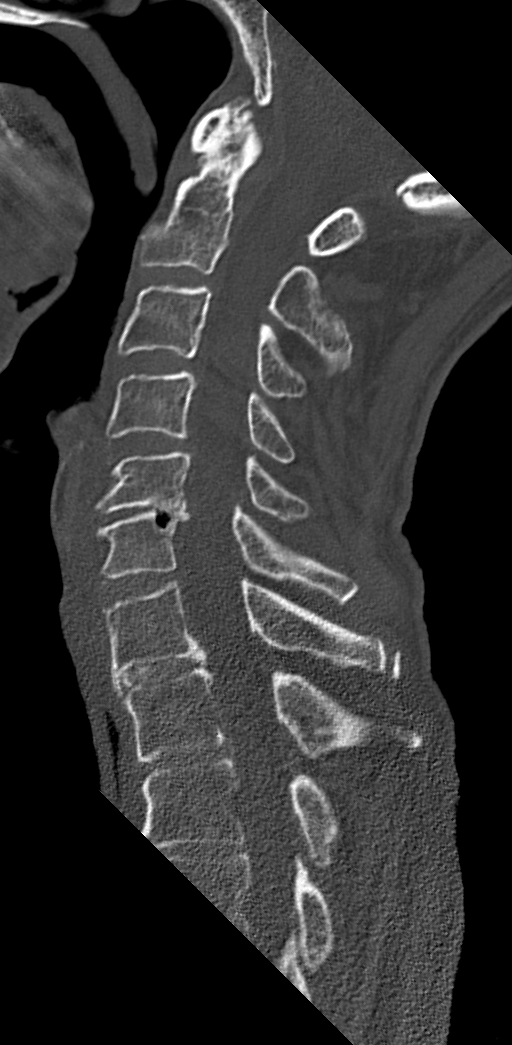
[im 34/58  bone]
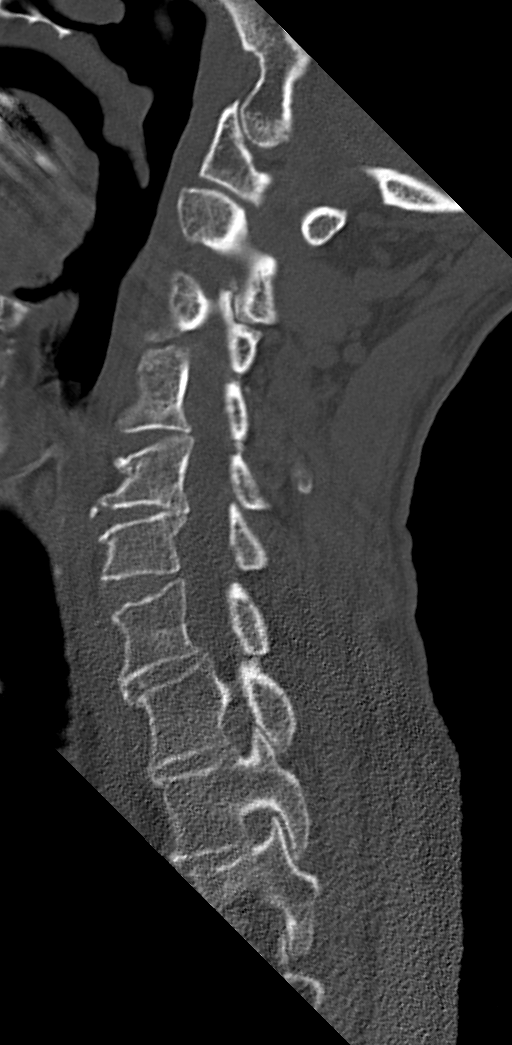
[im 39/58  bone]
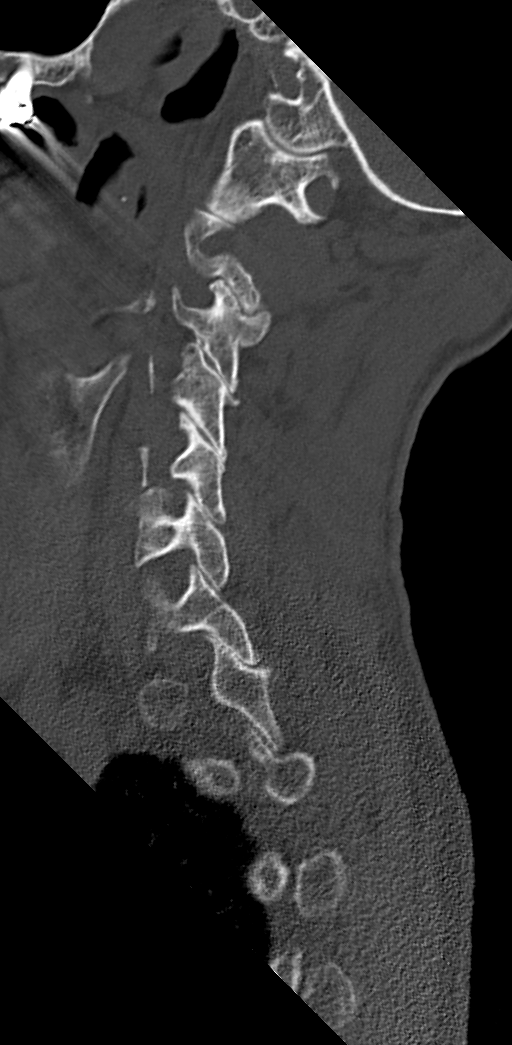

[10 of 27 positions shown; findings below may reference images not displayed]

FINDINGS: CT HEAD FINDINGS

Brain: Mild age-related atrophy and chronic microvascular ischemic
changes. There is no acute intracranial hemorrhage. No mass effect
or midline shift. No extra-axial fluid collection.

Vascular: No hyperdense vessel or unexpected calcification.

Skull: Normal. Negative for fracture or focal lesion.

Sinuses/Orbits: No acute finding.

Other: Right forehead hematoma.

CT CERVICAL SPINE FINDINGS

Alignment: No acute subluxation.

Skull base and vertebrae: No acute fracture.  Osteopenia.

Soft tissues and spinal canal: No prevertebral fluid or swelling. No
visible canal hematoma.

Disc levels: Multilevel degenerative changes and multilevel facet
arthropathy.

Upper chest: Biapical subpleural scarring.

Other: Bilateral carotid bulb calcified plaques.
IMPRESSION: 1. No acute intracranial hemorrhage.
2. No acute/traumatic cervical spine pathology.
